# Patient Record
Sex: Female | Born: 1970
Health system: Southern US, Community
[De-identification: ages and names within clinical notes are randomized; demographics above are authoritative.]

## PROBLEM LIST (undated history)

## (undated) DIAGNOSIS — I10 Essential (primary) hypertension: Secondary | ICD-10-CM

## (undated) DIAGNOSIS — F419 Anxiety disorder, unspecified: Secondary | ICD-10-CM

## (undated) HISTORY — PX: WISDOM TOOTH EXTRACTION: SHX21

## (undated) HISTORY — DX: Essential (primary) hypertension: I10

## (undated) HISTORY — DX: Anxiety disorder, unspecified: F41.9

---

## 1998-09-12 ENCOUNTER — Other Ambulatory Visit: Admission: RE | Admit: 1998-09-12 | Discharge: 1998-09-12 | Payer: Self-pay | Admitting: Obstetrics and Gynecology

## 1999-04-30 ENCOUNTER — Other Ambulatory Visit: Admission: RE | Admit: 1999-04-30 | Discharge: 1999-04-30 | Payer: Self-pay | Admitting: Obstetrics and Gynecology

## 1999-10-28 ENCOUNTER — Other Ambulatory Visit: Admission: RE | Admit: 1999-10-28 | Discharge: 1999-10-28 | Payer: Self-pay | Admitting: Obstetrics and Gynecology

## 2000-11-23 ENCOUNTER — Other Ambulatory Visit: Admission: RE | Admit: 2000-11-23 | Discharge: 2000-11-23 | Payer: Self-pay | Admitting: Obstetrics and Gynecology

## 2002-01-09 ENCOUNTER — Other Ambulatory Visit: Admission: RE | Admit: 2002-01-09 | Discharge: 2002-01-09 | Payer: Self-pay | Admitting: Obstetrics and Gynecology

## 2003-01-11 ENCOUNTER — Other Ambulatory Visit: Admission: RE | Admit: 2003-01-11 | Discharge: 2003-01-11 | Payer: Self-pay | Admitting: Obstetrics and Gynecology

## 2004-01-30 ENCOUNTER — Other Ambulatory Visit: Admission: RE | Admit: 2004-01-30 | Discharge: 2004-01-30 | Payer: Self-pay | Admitting: Obstetrics and Gynecology

## 2004-08-15 ENCOUNTER — Inpatient Hospital Stay (HOSPITAL_COMMUNITY): Admission: AD | Admit: 2004-08-15 | Discharge: 2004-08-17 | Payer: Self-pay | Admitting: Obstetrics and Gynecology

## 2004-09-11 ENCOUNTER — Other Ambulatory Visit: Admission: RE | Admit: 2004-09-11 | Discharge: 2004-09-11 | Payer: Self-pay | Admitting: Obstetrics and Gynecology

## 2005-10-15 ENCOUNTER — Other Ambulatory Visit: Admission: RE | Admit: 2005-10-15 | Discharge: 2005-10-15 | Payer: Self-pay | Admitting: Obstetrics and Gynecology

## 2011-03-19 ENCOUNTER — Other Ambulatory Visit: Payer: Self-pay | Admitting: Obstetrics and Gynecology

## 2011-03-19 DIAGNOSIS — R928 Other abnormal and inconclusive findings on diagnostic imaging of breast: Secondary | ICD-10-CM

## 2011-03-31 ENCOUNTER — Ambulatory Visit
Admission: RE | Admit: 2011-03-31 | Discharge: 2011-03-31 | Disposition: A | Discharge status: Home / Self Care | Payer: Self-pay | Source: Ambulatory Visit | Attending: Obstetrics and Gynecology | Admitting: Obstetrics and Gynecology

## 2011-03-31 DIAGNOSIS — R928 Other abnormal and inconclusive findings on diagnostic imaging of breast: Secondary | ICD-10-CM

## 2014-10-10 ENCOUNTER — Other Ambulatory Visit: Payer: Self-pay | Admitting: Obstetrics and Gynecology

## 2014-10-10 DIAGNOSIS — Z803 Family history of malignant neoplasm of breast: Secondary | ICD-10-CM

## 2016-11-11 DIAGNOSIS — Z6820 Body mass index (BMI) 20.0-20.9, adult: Secondary | ICD-10-CM | POA: Diagnosis not present

## 2016-11-11 DIAGNOSIS — Z1231 Encounter for screening mammogram for malignant neoplasm of breast: Secondary | ICD-10-CM | POA: Diagnosis not present

## 2016-11-11 DIAGNOSIS — Z01419 Encounter for gynecological examination (general) (routine) without abnormal findings: Secondary | ICD-10-CM | POA: Diagnosis not present

## 2017-11-16 DIAGNOSIS — Z01419 Encounter for gynecological examination (general) (routine) without abnormal findings: Secondary | ICD-10-CM | POA: Diagnosis not present

## 2017-11-16 DIAGNOSIS — Z1231 Encounter for screening mammogram for malignant neoplasm of breast: Secondary | ICD-10-CM | POA: Diagnosis not present

## 2017-11-16 DIAGNOSIS — Z803 Family history of malignant neoplasm of breast: Secondary | ICD-10-CM | POA: Diagnosis not present

## 2017-11-16 DIAGNOSIS — Z6820 Body mass index (BMI) 20.0-20.9, adult: Secondary | ICD-10-CM | POA: Diagnosis not present

## 2017-11-16 DIAGNOSIS — Z808 Family history of malignant neoplasm of other organs or systems: Secondary | ICD-10-CM | POA: Diagnosis not present

## 2018-01-12 DIAGNOSIS — E785 Hyperlipidemia, unspecified: Secondary | ICD-10-CM | POA: Diagnosis not present

## 2018-01-12 DIAGNOSIS — Z809 Family history of malignant neoplasm, unspecified: Secondary | ICD-10-CM | POA: Diagnosis not present

## 2018-01-12 DIAGNOSIS — Z13220 Encounter for screening for lipoid disorders: Secondary | ICD-10-CM | POA: Diagnosis not present

## 2018-01-12 DIAGNOSIS — Z13228 Encounter for screening for other metabolic disorders: Secondary | ICD-10-CM | POA: Diagnosis not present

## 2018-01-12 DIAGNOSIS — Z1321 Encounter for screening for nutritional disorder: Secondary | ICD-10-CM | POA: Diagnosis not present

## 2018-01-12 DIAGNOSIS — I272 Pulmonary hypertension, unspecified: Secondary | ICD-10-CM | POA: Diagnosis not present

## 2018-01-12 DIAGNOSIS — R5383 Other fatigue: Secondary | ICD-10-CM | POA: Diagnosis not present

## 2018-01-12 DIAGNOSIS — Z1329 Encounter for screening for other suspected endocrine disorder: Secondary | ICD-10-CM | POA: Diagnosis not present

## 2018-01-12 DIAGNOSIS — E559 Vitamin D deficiency, unspecified: Secondary | ICD-10-CM | POA: Diagnosis not present

## 2018-01-20 ENCOUNTER — Other Ambulatory Visit: Payer: Self-pay | Admitting: Obstetrics and Gynecology

## 2018-01-20 DIAGNOSIS — Z803 Family history of malignant neoplasm of breast: Secondary | ICD-10-CM

## 2018-03-14 DIAGNOSIS — R030 Elevated blood-pressure reading, without diagnosis of hypertension: Secondary | ICD-10-CM | POA: Diagnosis not present

## 2018-04-19 DIAGNOSIS — R030 Elevated blood-pressure reading, without diagnosis of hypertension: Secondary | ICD-10-CM | POA: Diagnosis not present

## 2018-04-19 DIAGNOSIS — H524 Presbyopia: Secondary | ICD-10-CM | POA: Diagnosis not present

## 2018-04-19 DIAGNOSIS — Z0131 Encounter for examination of blood pressure with abnormal findings: Secondary | ICD-10-CM | POA: Diagnosis not present

## 2018-04-22 DIAGNOSIS — F419 Anxiety disorder, unspecified: Secondary | ICD-10-CM | POA: Diagnosis not present

## 2018-04-22 DIAGNOSIS — R030 Elevated blood-pressure reading, without diagnosis of hypertension: Secondary | ICD-10-CM | POA: Diagnosis not present

## 2019-01-04 DIAGNOSIS — F419 Anxiety disorder, unspecified: Secondary | ICD-10-CM | POA: Diagnosis not present

## 2019-01-09 DIAGNOSIS — Z6821 Body mass index (BMI) 21.0-21.9, adult: Secondary | ICD-10-CM | POA: Diagnosis not present

## 2019-01-09 DIAGNOSIS — Z1231 Encounter for screening mammogram for malignant neoplasm of breast: Secondary | ICD-10-CM | POA: Diagnosis not present

## 2019-01-09 DIAGNOSIS — Z01419 Encounter for gynecological examination (general) (routine) without abnormal findings: Secondary | ICD-10-CM | POA: Diagnosis not present

## 2019-01-11 ENCOUNTER — Other Ambulatory Visit: Payer: Self-pay | Admitting: Obstetrics and Gynecology

## 2019-01-11 DIAGNOSIS — R928 Other abnormal and inconclusive findings on diagnostic imaging of breast: Secondary | ICD-10-CM

## 2019-01-12 ENCOUNTER — Ambulatory Visit
Admission: RE | Admit: 2019-01-12 | Discharge: 2019-01-12 | Disposition: A | Discharge status: Home / Self Care | Payer: BC Managed Care – PPO | Source: Ambulatory Visit | Attending: Obstetrics and Gynecology | Admitting: Obstetrics and Gynecology

## 2019-01-12 ENCOUNTER — Ambulatory Visit: Payer: Self-pay

## 2019-01-12 ENCOUNTER — Other Ambulatory Visit: Payer: Self-pay

## 2019-01-12 DIAGNOSIS — R928 Other abnormal and inconclusive findings on diagnostic imaging of breast: Secondary | ICD-10-CM

## 2019-01-12 DIAGNOSIS — R922 Inconclusive mammogram: Secondary | ICD-10-CM | POA: Diagnosis not present

## 2019-01-12 DIAGNOSIS — N6002 Solitary cyst of left breast: Secondary | ICD-10-CM | POA: Diagnosis not present

## 2019-01-18 ENCOUNTER — Other Ambulatory Visit: Payer: Self-pay | Admitting: Obstetrics and Gynecology

## 2019-01-18 DIAGNOSIS — Z9189 Other specified personal risk factors, not elsewhere classified: Secondary | ICD-10-CM

## 2019-03-09 DIAGNOSIS — R0789 Other chest pain: Secondary | ICD-10-CM | POA: Diagnosis not present

## 2019-03-20 NOTE — Progress Notes (Signed)
Cardiology Office Note   Date:  03/22/2019   ID:  Emily Valdez, DOB Jan 26, 1971, MRN [id number]  PCP:  Joycelyn Rua, MD  Cardiologist:   No primary care provider on file. Referring:  Joycelyn Rua, MD  Chief Complaint  Patient presents with  . Abnormal ECG      History of Present Illness: Emily Valdez is a 48 y.o. female who is referred by Joycelyn Rua, MD for evaluation of palpitations and an abnormal EK.  The patient has no past cardiac history.  Recently she has had hypertension.  She has been started on Norvasc.  She has had some increased stress at home and perhaps some anxiety that is now being treated.  She reports strong heart beats.  She is not describing irregular beats and has had no syncope or presycnope.  The patient denies any new symptoms such as chest discomfort, neck or arm discomfort. There has been no new shortness of breath, PND or orthopnea. There have been no reported palpitations, presyncope or syncope.   She feels like the heart is beating hard .  This happens during the day at rest.  However, she is able to exercise vigorously without symptoms.  She does report that she has some chest discomfort that seems to be positional.  She might have this lying on her right or left side.  This seems to be in the mid portion of the chest.  She had an EKG at her primary care office with questionable old anterior MI.  There were no past EKGs for comparison.      Past Medical History:  Diagnosis Date  . Hypertension     History reviewed. No pertinent surgical history.   Current Outpatient Medications  Medication Sig Dispense Refill  . amLODipine (NORVASC) 2.5 MG tablet Take 2.5 mg by mouth daily.    Marland Kitchen escitalopram (LEXAPRO) 5 MG tablet Take 5 mg by mouth daily.    . norgestrel-ethinyl estradiol (CRYSELLE-28) 0.3-30 MG-MCG tablet Take 1 tablet by mouth daily.     No current facility-administered medications for this visit.     Allergies:   Patient has no  known allergies.    Social History:  The patient  reports that she has quit smoking. Her smoking use included cigarettes. She has never used smokeless tobacco.   Family History:  The patient's family history is not contributory for early coronary artery disease in a first-degree relative or sudden death and young ages.  There is no history of syncope or heart failure. family history is not on file.    ROS:  Please see the history of present illness.   Otherwise, review of systems are positive for none.   All other systems are reviewed and negative.    PHYSICAL EXAM: VS:  BP 132/88   Pulse 71   Ht 5\' 2"  (1.575 m)   Wt 121 lb 3.2 oz (55 kg)   BMI 22.17 kg/m  , BMI Body mass index is 22.17 kg/m. GENERAL:  Well appearing HEENT:  Pupils equal round and reactive, fundi not visualized, oral mucosa unremarkable NECK:  No jugular venous distention, waveform within normal limits, carotid upstroke brisk and symmetric, no bruits, no thyromegaly LYMPHATICS:  No cervical, inguinal adenopathy LUNGS:  Clear to auscultation bilaterally BACK:  No CVA tenderness CHEST:  Unremarkable HEART:  PMI not displaced or sustained,S1 and S2 within normal limits, no S3, no S4, no clicks, no rubs, no murmurs ABD:  Flat, positive bowel sounds normal  in frequency in pitch, no bruits, no rebound, no guarding, no midline pulsatile mass, no hepatomegaly, no splenomegaly EXT:  2 plus pulses throughout, no edema, no cyanosis no clubbing SKIN:  No rashes no nodules NEURO:  Cranial nerves II through XII grossly intact, motor grossly intact throughout PSYCH:  Cognitively intact, oriented to person place and time    EKG:  EKG is ordered today. The ekg ordered today demonstrates sinus rhythm, rate 71, axis within normal limits, intervals within normal limits, no acute ST-T wave changes.   Recent Labs: No results found for requested labs within last 8760 hours.    Lipid Panel No results found for: CHOL, TRIG, HDL,  CHOLHDL, VLDL, LDLCALC, LDLDIRECT    Wt Readings from Last 3 Encounters:  03/21/19 121 lb 3.2 oz (55 kg)      Other studies Reviewed: Additional studies/ records that were reviewed today include: EKG from primary . Review of the above records demonstrates:  Please see elsewhere in the note.     ASSESSMENT AND PLAN:  PALPITATIONS:    The patient describes some strong heartbeats.  Of asked her to get an Alive Cor device and possibly a blood pressure cuff.  This will help [place] when she is having those episodes.  I suspect that she is having sinus rhythm or potentially some ectopy but further management will be based on these results.  I will not empirically treat at this point.  Await for more data points.  HTN: Blood pressures controlled.  If however she needs future med titration and has any further strong heartbeats perhaps she would be better with Cardizem or even a beta-blocker.  ABNORMAL EKG: The the patient's poor anterior R wave progression with lead placement.  I do not suspect a structurally abnormal heart or other issues given the normal appearance of the EKG today.  Current medicines are reviewed at length with the patient today.  The patient does not have concerns regarding medicines.  The following changes have been made:  no change  Labs/ tests ordered today include:   Orders Placed This Encounter  Procedures  . EKG 12-Lead     Disposition:   FU with me as needed based on there results of the above    Signed, Minus Breeding, MD  03/22/2019 4:43 PM    [facility]

## 2019-03-21 ENCOUNTER — Ambulatory Visit: Payer: BC Managed Care – PPO | Admitting: Cardiology

## 2019-03-21 ENCOUNTER — Encounter: Payer: Self-pay | Admitting: Cardiology

## 2019-03-21 ENCOUNTER — Other Ambulatory Visit: Payer: Self-pay

## 2019-03-21 VITALS — BP 132/88 | HR 71 | Ht 62.0 in | Wt 121.2 lb

## 2019-03-21 DIAGNOSIS — R0789 Other chest pain: Secondary | ICD-10-CM | POA: Diagnosis not present

## 2019-03-21 DIAGNOSIS — R079 Chest pain, unspecified: Secondary | ICD-10-CM

## 2019-03-21 NOTE — Patient Instructions (Signed)
Medication Instructions:  Your physician recommends that you continue on your current medications as directed. Please refer to the Current Medication list given to you today.  If you need a refill on your cardiac medications before your next appointment, please call your pharmacy.   Lab work: NONE  Testing/Procedures: NONE  Follow-Up: As needed   Any Other Special Instructions Will Be Listed Below (If Applicable). Invest in an [employer] Blood Pressure Machine.  Use the app Alivecore.

## 2019-03-22 ENCOUNTER — Encounter: Payer: Self-pay | Admitting: Cardiology

## 2019-07-07 DIAGNOSIS — I10 Essential (primary) hypertension: Secondary | ICD-10-CM | POA: Diagnosis not present

## 2019-07-07 DIAGNOSIS — F419 Anxiety disorder, unspecified: Secondary | ICD-10-CM | POA: Diagnosis not present

## 2019-12-01 DIAGNOSIS — M25571 Pain in right ankle and joints of right foot: Secondary | ICD-10-CM | POA: Diagnosis not present

## 2020-01-15 DIAGNOSIS — Z1231 Encounter for screening mammogram for malignant neoplasm of breast: Secondary | ICD-10-CM | POA: Diagnosis not present

## 2020-06-03 DIAGNOSIS — Z6823 Body mass index (BMI) 23.0-23.9, adult: Secondary | ICD-10-CM | POA: Diagnosis not present

## 2020-06-03 DIAGNOSIS — Z01419 Encounter for gynecological examination (general) (routine) without abnormal findings: Secondary | ICD-10-CM | POA: Diagnosis not present

## 2020-07-09 DIAGNOSIS — F419 Anxiety disorder, unspecified: Secondary | ICD-10-CM | POA: Diagnosis not present

## 2020-07-09 DIAGNOSIS — I10 Essential (primary) hypertension: Secondary | ICD-10-CM | POA: Diagnosis not present

## 2020-10-23 ENCOUNTER — Telehealth: Payer: Self-pay | Admitting: *Deleted

## 2020-10-23 NOTE — Telephone Encounter (Signed)
Pt came in for PV and RS to July 19- this is more than 60 days from today so PV Rs as well- I did give her Miralax danis prep instructions today- we went over these instructions- Pv Rs to 7-5 at 1 pm- will be a virtual PV

## 2020-11-08 ENCOUNTER — Encounter: Payer: BC Managed Care – PPO | Admitting: Gastroenterology

## 2020-12-09 ENCOUNTER — Other Ambulatory Visit: Payer: Self-pay

## 2020-12-24 ENCOUNTER — Ambulatory Visit (AMBULATORY_SURGERY_CENTER): Payer: BC Managed Care – PPO

## 2020-12-24 ENCOUNTER — Other Ambulatory Visit: Payer: Self-pay

## 2020-12-24 VITALS — Ht 62.0 in | Wt 126.0 lb

## 2020-12-24 DIAGNOSIS — Z1211 Encounter for screening for malignant neoplasm of colon: Secondary | ICD-10-CM

## 2020-12-24 DIAGNOSIS — Z800 Family history of malignant neoplasm of digestive organs: Secondary | ICD-10-CM

## 2020-12-24 NOTE — Progress Notes (Signed)
Patient's pre-visit was done today over the phone with the patient due to COVID-19 pandemic. Name,DOB and address verified. Insurance verified. Patient denies any allergies to Eggs and Soy. Patient denies any problems with anesthesia/sedation. Patient denies taking diet pills or blood thinners.  Patient understands to call us back with any questions or concerns. Patient is aware of our care-partner policy and Covid-19 safety protocol.   The patient is COVID-19 vaccinated, per patient.    Pt said she had Split dose Miralax prep instructions at home.   Pt had no questions.

## 2021-01-03 ENCOUNTER — Encounter: Payer: Self-pay | Admitting: Gastroenterology

## 2021-01-07 ENCOUNTER — Encounter: Payer: Self-pay | Admitting: Gastroenterology

## 2021-01-07 ENCOUNTER — Other Ambulatory Visit: Payer: Self-pay

## 2021-01-07 ENCOUNTER — Ambulatory Visit (AMBULATORY_SURGERY_CENTER): Payer: BC Managed Care – PPO | Admitting: Gastroenterology

## 2021-01-07 VITALS — BP 127/86 | HR 66 | Temp 98.7°F | Resp 12 | Ht 62.0 in | Wt 126.0 lb

## 2021-01-07 DIAGNOSIS — Z800 Family history of malignant neoplasm of digestive organs: Secondary | ICD-10-CM | POA: Diagnosis not present

## 2021-01-07 DIAGNOSIS — Z1211 Encounter for screening for malignant neoplasm of colon: Secondary | ICD-10-CM

## 2021-01-07 MED ORDER — SODIUM CHLORIDE 0.9 % IV SOLN: 500.0000 mL | Freq: Once | INTRAVENOUS | Status: DC

## 2021-01-07 NOTE — Progress Notes (Signed)
To PACU, VSS. Report to [email or web address].tb 

## 2021-01-07 NOTE — Progress Notes (Signed)
CW - VS   

## 2021-01-07 NOTE — Patient Instructions (Signed)
Repeat colonoscopy in 5 years for screening purposes. ° °YOU HAD AN ENDOSCOPIC PROCEDURE TODAY AT THE [facility]:   Refer to the procedure report that was given to you for any specific questions about what was found during the examination.  If the procedure report does not answer your questions, please call your gastroenterologist to clarify.  If you requested that your care partner not be given the details of your procedure findings, then the procedure report has been included in a sealed envelope for you to review at your convenience later. ° °YOU SHOULD EXPECT: Some feelings of bloating in the abdomen. Passage of more gas than usual.  Walking can help get rid of the air that was put into your GI tract during the procedure and reduce the bloating. If you had a lower endoscopy (such as a colonoscopy or flexible sigmoidoscopy) you may notice spotting of blood in your stool or on the toilet paper. If you underwent a bowel prep for your procedure, you may not have a normal bowel movement for a few days. ° °Please Note:  You might notice some irritation and congestion in your nose or some drainage.  This is from the oxygen used during your procedure.  There is no need for concern and it should clear up in a day or so. ° °SYMPTOMS TO REPORT IMMEDIATELY: ° °Following lower endoscopy (colonoscopy or flexible sigmoidoscopy): ° Excessive amounts of blood in the stool ° Significant tenderness or worsening of abdominal pains ° Swelling of the abdomen that is new, acute ° Fever of 100°F or higher ° °For urgent or emergent issues, a gastroenterologist can be reached at any hour by calling [phone]. °Do not use MyChart messaging for urgent concerns.  ° ° °DIET:  We do recommend a small meal at first, but then you may proceed to your regular diet.  Drink plenty of fluids but you should avoid alcoholic beverages for 24 hours. ° °ACTIVITY:  You should plan to take it easy for the rest of today and you should NOT  DRIVE or use heavy machinery until tomorrow (because of the sedation medicines used during the test).   ° °FOLLOW UP: °Our staff will call the number listed on your records 48-72 hours following your procedure to check on you and address any questions or concerns that you may have regarding the information given to you following your procedure. If we do not reach you, we will leave a message.  We will attempt to reach you two times.  During this call, we will ask if you have developed any symptoms of COVID 19. If you develop any symptoms (ie: fever, flu-like symptoms, shortness of breath, cough etc.) before then, please call [phone].  If you test positive for Covid 19 in the 2 weeks post procedure, please call and report this information to us.   ° °If any biopsies were taken you will be contacted by phone or by letter within the next 1-3 weeks.  Please call us at [phone] if you have not heard about the biopsies in 3 weeks.  ° ° °SIGNATURES/CONFIDENTIALITY: °You and/or your care partner have signed paperwork which will be entered into your electronic medical record.  These signatures attest to the fact that that the information above on your After Visit Summary has been reviewed and is understood.  Full responsibility of the confidentiality of this discharge information lies with you and/or your care-partner. °  °

## 2021-01-07 NOTE — Op Note (Signed)
[facility] Patient Name: Emily Valdez Procedure Date: 01/07/2021 8:38 AM MRN: [id number] Endoscopist: Sherilyn Cooter L. Myrtie Neither , MD Age: 50 Referring MD:  Date of Birth: Jun 22, 1971 Gender: Female Account #: [id number] Procedure:                Colonoscopy Indications:              Screening in patient at increased risk: Colorectal                            cancer in father 38 or older, This is the patient's                            first colonoscopy Medicines:                Monitored Anesthesia Care Procedure:                Pre-Anesthesia Assessment:                           - Prior to the procedure, a History and Physical                            was performed, and patient medications and                            allergies were reviewed. The patient's tolerance of                            previous anesthesia was also reviewed. The risks                            and benefits of the procedure and the sedation                            options and risks were discussed with the patient.                            All questions were answered, and informed consent                            was obtained. Prior Anticoagulants: The patient has                            taken no previous anticoagulant or antiplatelet                            agents. ASA Grade Assessment: II - A patient with                            mild systemic disease. After reviewing the risks                            and benefits, the patient was deemed in  satisfactory condition to undergo the procedure.                           After obtaining informed consent, the colonoscope                            was passed under direct vision. Throughout the                            procedure, the patient's blood pressure, pulse, and                            oxygen saturations were monitored continuously. The                            Olympus PCF-H190DL (#[id number])  Colonoscope was                            introduced through the anus and advanced to the the                            cecum, identified by appendiceal orifice and                            ileocecal valve. The colonoscopy was somewhat                            difficult due to a redundant colon. Successful                            completion of the procedure was aided by changing                            the patient to a semi-supine position and using                            manual pressure. The patient tolerated the                            procedure well. The quality of the bowel                            preparation was excellent after lavage. The bowel                            preparation used was Miralax. Scope In: 8:42:55 AM Scope Out: 9:01:41 AM Scope Withdrawal Time: 0 hours 10 minutes 55 seconds  Total Procedure Duration: 0 hours 18 minutes 46 seconds  Findings:                 The perianal and digital rectal examinations were                            normal.  The entire examined colon appeared normal on direct                            and retroflexion views. Complications:            No immediate complications. Estimated Blood Loss:     Estimated blood loss: none. Impression:               - The entire examined colon is normal on direct and                            retroflexion views.                           - No specimens collected. Recommendation:           - Patient has a contact number available for                            emergencies. The signs and symptoms of potential                            delayed complications were discussed with the                            patient. Return to normal activities tomorrow.                            Written discharge instructions were provided to the                            patient.                           - Resume previous diet.                           - Continue  present medications.                           - Repeat colonoscopy in 5 years for screening                            purposes. Pamalee Marcoe L. Myrtie Neither, MD 01/07/2021 9:05:50 AM This report has been signed electronically.

## 2021-01-09 ENCOUNTER — Telehealth: Payer: Self-pay

## 2021-01-09 NOTE — Telephone Encounter (Signed)
  Follow up Call-  Call back number 01/07/2021  Post procedure Call Back phone  # [phone]  Permission to leave phone message Yes  Some recent data might be hidden     Patient questions:  Do you have a fever, pain , or abdominal swelling? No. Pain Score  0 *  Have you tolerated food without any problems? Yes.    Have you been able to return to your normal activities? Yes.    Do you have any questions about your discharge instructions: Diet   No. Medications  No. Follow up visit  No.  Do you have questions or concerns about your Care? No.  Actions: * If pain score is 4 or above: No action needed, pain <4.  Have you developed a fever since your procedure? no  2.   Have you had an respiratory symptoms (SOB or cough) since your procedure? no  3.   Have you tested positive for COVID 19 since your procedure no  4.   Have you had any family members/close contacts diagnosed with the COVID 19 since your procedure?  no   If yes to any of these questions please route to Laverna Peace, RN and Karlton Lemon, RN

## 2021-01-09 NOTE — Telephone Encounter (Signed)
First attempt follow up call to pt, No answer. 

## 2021-03-10 DIAGNOSIS — F419 Anxiety disorder, unspecified: Secondary | ICD-10-CM | POA: Diagnosis not present

## 2021-03-10 DIAGNOSIS — I10 Essential (primary) hypertension: Secondary | ICD-10-CM | POA: Diagnosis not present

## 2021-06-18 DIAGNOSIS — Z01419 Encounter for gynecological examination (general) (routine) without abnormal findings: Secondary | ICD-10-CM | POA: Diagnosis not present

## 2021-06-18 DIAGNOSIS — Z6822 Body mass index (BMI) 22.0-22.9, adult: Secondary | ICD-10-CM | POA: Diagnosis not present

## 2021-06-18 DIAGNOSIS — Z1231 Encounter for screening mammogram for malignant neoplasm of breast: Secondary | ICD-10-CM | POA: Diagnosis not present

## 2021-06-24 ENCOUNTER — Other Ambulatory Visit: Payer: Self-pay | Admitting: Obstetrics and Gynecology

## 2021-06-24 DIAGNOSIS — R928 Other abnormal and inconclusive findings on diagnostic imaging of breast: Secondary | ICD-10-CM

## 2021-06-26 ENCOUNTER — Ambulatory Visit
Admission: RE | Admit: 2021-06-26 | Discharge: 2021-06-26 | Disposition: A | Discharge status: Home / Self Care | Payer: BC Managed Care – PPO | Source: Ambulatory Visit | Attending: Obstetrics and Gynecology | Admitting: Obstetrics and Gynecology

## 2021-06-26 DIAGNOSIS — R928 Other abnormal and inconclusive findings on diagnostic imaging of breast: Secondary | ICD-10-CM

## 2021-06-26 DIAGNOSIS — R922 Inconclusive mammogram: Secondary | ICD-10-CM | POA: Diagnosis not present

## 2021-08-01 DIAGNOSIS — L918 Other hypertrophic disorders of the skin: Secondary | ICD-10-CM | POA: Diagnosis not present

## 2021-08-01 DIAGNOSIS — L304 Erythema intertrigo: Secondary | ICD-10-CM | POA: Diagnosis not present

## 2021-08-01 DIAGNOSIS — D225 Melanocytic nevi of trunk: Secondary | ICD-10-CM | POA: Diagnosis not present

## 2021-08-01 DIAGNOSIS — L710 Perioral dermatitis: Secondary | ICD-10-CM | POA: Diagnosis not present

## 2021-08-01 DIAGNOSIS — L578 Other skin changes due to chronic exposure to nonionizing radiation: Secondary | ICD-10-CM | POA: Diagnosis not present

## 2021-08-01 DIAGNOSIS — D485 Neoplasm of uncertain behavior of skin: Secondary | ICD-10-CM | POA: Diagnosis not present

## 2021-09-04 DIAGNOSIS — Z13820 Encounter for screening for osteoporosis: Secondary | ICD-10-CM | POA: Diagnosis not present

## 2022-03-04 DIAGNOSIS — F419 Anxiety disorder, unspecified: Secondary | ICD-10-CM | POA: Diagnosis not present

## 2022-03-04 DIAGNOSIS — I10 Essential (primary) hypertension: Secondary | ICD-10-CM | POA: Diagnosis not present

## 2022-03-04 DIAGNOSIS — R1013 Epigastric pain: Secondary | ICD-10-CM | POA: Diagnosis not present

## 2022-08-13 DIAGNOSIS — Z1231 Encounter for screening mammogram for malignant neoplasm of breast: Secondary | ICD-10-CM | POA: Diagnosis not present

## 2022-08-13 DIAGNOSIS — Z01419 Encounter for gynecological examination (general) (routine) without abnormal findings: Secondary | ICD-10-CM | POA: Diagnosis not present

## 2022-08-17 ENCOUNTER — Other Ambulatory Visit: Payer: Self-pay | Admitting: Obstetrics and Gynecology

## 2022-08-17 DIAGNOSIS — Z803 Family history of malignant neoplasm of breast: Secondary | ICD-10-CM

## 2022-09-02 DIAGNOSIS — I10 Essential (primary) hypertension: Secondary | ICD-10-CM | POA: Diagnosis not present

## 2022-09-02 DIAGNOSIS — F419 Anxiety disorder, unspecified: Secondary | ICD-10-CM | POA: Diagnosis not present

## 2022-09-24 DIAGNOSIS — E8941 Symptomatic postprocedural ovarian failure: Secondary | ICD-10-CM | POA: Diagnosis not present

## 2022-10-11 ENCOUNTER — Emergency Department (HOSPITAL_BASED_OUTPATIENT_CLINIC_OR_DEPARTMENT_OTHER): Payer: BC Managed Care – PPO | Admitting: Radiology

## 2022-10-11 ENCOUNTER — Emergency Department (HOSPITAL_BASED_OUTPATIENT_CLINIC_OR_DEPARTMENT_OTHER)
Admission: EM | Admit: 2022-10-11 | Discharge: 2022-10-11 | Disposition: A | Discharge status: Home / Self Care | Payer: BC Managed Care – PPO | Attending: Emergency Medicine | Admitting: Emergency Medicine

## 2022-10-11 DIAGNOSIS — W274XXA Contact with kitchen utensil, initial encounter: Secondary | ICD-10-CM | POA: Insufficient documentation

## 2022-10-11 DIAGNOSIS — Z23 Encounter for immunization: Secondary | ICD-10-CM | POA: Insufficient documentation

## 2022-10-11 DIAGNOSIS — S61411A Laceration without foreign body of right hand, initial encounter: Secondary | ICD-10-CM | POA: Diagnosis not present

## 2022-10-11 DIAGNOSIS — S61216A Laceration without foreign body of right little finger without damage to nail, initial encounter: Secondary | ICD-10-CM | POA: Diagnosis not present

## 2022-10-11 MED ORDER — LIDOCAINE-EPINEPHRINE-TETRACAINE (LET) TOPICAL GEL
3.0000 mL | Freq: Once | TOPICAL | Status: AC
  Administered 2022-10-11: 3 mL via TOPICAL
  Filled 2022-10-11: qty 3

## 2022-10-11 MED ORDER — LIDOCAINE-EPINEPHRINE (PF) 2 %-1:200000 IJ SOLN: 20.0000 mL | Freq: Once | INTRAMUSCULAR | Status: DC

## 2022-10-11 MED ORDER — LIDOCAINE HCL 2 % IJ SOLN
10.0000 mL | Freq: Once | INTRAMUSCULAR | Status: DC
  Filled 2022-10-11: qty 20

## 2022-10-11 MED ORDER — TETANUS-DIPHTH-ACELL PERTUSSIS 5-2.5-18.5 LF-MCG/0.5 IM SUSY
0.5000 mL | PREFILLED_SYRINGE | Freq: Once | INTRAMUSCULAR | Status: AC
  Administered 2022-10-11: 0.5 mL via INTRAMUSCULAR
  Filled 2022-10-11: qty 0.5

## 2022-10-11 NOTE — Discharge Instructions (Signed)
You are seen in the emergency department for a laceration.  Thankfully there is no evidence of any fractures or dislocations in your finger for this laceration.  There is no nail involvement either.  A simple suture repair was performed on the pinky of the right hand with 5 sutures placed.  I would advise a follow-up with her primary provider as well as a hand surgeon to ensure that there is healing without any complications.  If you have any concerns for any signs of infection such as redness, purulent drainage, or inability to move the finger please return to the emergency department for evaluation.

## 2022-10-11 NOTE — ED Provider Notes (Signed)
[place] EMERGENCY DEPARTMENT AT [facility] Provider Note   CSN: [id number] Arrival date & time: 10/11/22  1719     History Chief Complaint  Patient presents with   Laceration    Emily Valdez is a 52 y.o. female.  Patient presents emergency department for a laceration to the right pinky.  She reports that she was using a mandolin and she sliced into her finger.  She reports the bleeding is mostly contained prior to arriving in the emergency department.  Unsure of last tetanus shot.  Denies any significant pain or loss of ability to function over the affected pinky.   Laceration      Home Medications Prior to Admission medications   Medication Sig Start Date End Date Taking? Authorizing Provider  amLODipine (NORVASC) 2.5 MG tablet Take 2.5 mg by mouth daily. 03/09/19   [provider]  escitalopram (LEXAPRO) 5 MG tablet Take 5 mg by mouth daily. 01/04/19   [provider]  norgestrel-ethinyl estradiol (CRYSELLE-28) 0.3-30 MG-MCG tablet Take 1 tablet by mouth daily.    [provider]  VITAMIN D PO Take by mouth.    [provider]      Allergies    Patient has no known allergies.    Review of Systems   Review of Systems  Skin:  Positive for wound.  All other systems reviewed and are negative.   Physical Exam Updated Vital Signs BP (!) 143/100   Pulse 65   Temp 98.3 F (36.8 C) (Oral)   Resp 20   SpO2 100%  Physical Exam Vitals and nursing note reviewed.  HENT:     Head: Normocephalic and atraumatic.  Eyes:     General: No scleral icterus.    Conjunctiva/sclera: Conjunctivae normal.  Musculoskeletal:     Comments: Range of motion testing in patient's right hand normal without any acute deficiencies noted.  No limitations due to pain.  Skin:    General: Skin is warm and dry.     Capillary Refill: Capillary refill takes less than 2 seconds.     Findings: Lesion present.     Comments: 4 cm laceration noted to the  right little finger.  There appears to be a skin flap present from the laceration sustained.  Minimal active bleeding at this time.     ED Results / Procedures / Treatments   Labs (all labs ordered are listed, but only abnormal results are displayed) Labs Reviewed - No data to display  EKG None  Radiology DG Hand Complete Right  Result Date: 10/11/2022 CLINICAL DATA:  Laceration to the fourth and fifth digits. EXAM: RIGHT HAND - COMPLETE 3+ VIEW COMPARISON:  None Available. FINDINGS: There is no evidence of fracture or dislocation. There is no evidence of arthropathy or other focal bone abnormality. No radiopaque foreign body is identified. IMPRESSION: No acute osseous injury or radiopaque foreign body. Electronically Signed   By: Romona Curls M.D.   On: 10/11/2022 18:32    Procedures .Marland KitchenLaceration Repair  Date/Time: 10/11/2022 9:39 PM  Performed by: Smitty Knudsen, PA-C Authorized by: Smitty Knudsen, PA-C   Consent:    Consent obtained:  Verbal   Consent given by:  Patient   Risks, benefits, and alternatives were discussed: yes     Risks discussed:  Infection and pain Universal protocol:    Patient identity confirmed:  Verbally with patient Anesthesia:    Anesthesia method:  Nerve block   Block needle gauge:  25 G  Block anesthetic:  Lidocaine 1% w/o epi   Block injection procedure:  Anatomic landmarks identified, introduced needle and negative aspiration for blood   Block outcome:  Anesthesia achieved Laceration details:    Location:  Finger   Finger location:  R small finger   Length (cm):  4   Depth (mm):  2 Pre-procedure details:    Preparation:  Patient was prepped and draped in usual sterile fashion Exploration:    Limited defect created (wound extended): yes     Hemostasis achieved with:  Direct pressure   Imaging obtained: x-ray     Imaging outcome: foreign body not noted     Contaminated: no   Treatment:    Area cleansed with:  Povidone-iodine and  saline   Amount of cleaning:  Standard   Irrigation solution:  Sterile saline   Irrigation method:  Syringe   Visualized foreign bodies/material removed: no   Skin repair:    Repair method:  Sutures   Suture size:  5-0   Suture material:  Nylon   Number of sutures:  5 Approximation:    Approximation:  Loose Repair type:    Repair type:  Simple Post-procedure details:    Dressing:  Non-adherent dressing    Medications Ordered in ED Medications  lidocaine (XYLOCAINE) 2 % (with pres) injection 200 mg (has no administration in time range)  Tdap (BOOSTRIX) injection 0.5 mL (0.5 mLs Intramuscular Given 10/11/22 1856)  lidocaine-EPINEPHrine-tetracaine (LET) topical gel (3 mLs Topical Given 10/11/22 1856)    ED Course/ Medical Decision Making/ A&P                           Medical Decision Making Amount and/or Complexity of Data Reviewed Radiology: ordered.  Risk Prescription drug management.   This patient presents to the ED for concern of laceration.  Differential diagnosis includes nail avulsion, skin laceration, puncture wound, dislocation or fracture of the phalange   Imaging Studies ordered:  I ordered imaging studies including x-ray of right hand I independently visualized and interpreted imaging which showed no evidence of any foreign bodies retained objects I agree with the radiologist interpretation   Medicines ordered and prescription drug management:  I ordered medication including Tdap, lidocaine for tetanus prophylaxis, local anesthetic to Reevaluation of the patient after these medicines showed that the patient improved I have reviewed the patients home medicines and have made adjustments as needed   Problem List / ED Course:  Patient presents emergency department complaints of a laceration to the right pinky.  She reports she was using a mandolin to cut her hand on hand.  Unsure if she is up-to-date on last tetanus.  Reports that she is baseline mobility  and function with this pinky.  Denies any significant derangements in function.  Laceration repair performed using 5-0 Vicryl sutures to reapproximate the area for improved healing.  Advised patient of proper wound care management for the repaired site.  Encourage patient to follow-up with primary care provider to ensure area is healing without any complications.  Also advised patient to follow-up with hand surgeon for further evaluation to ensure there are no acute abnormalities or concerns arise with healing.  Patient is agreeable to treatment plan verbalized understanding return precautions.  All questions answered prior to patient discharge.  Final Clinical Impression(s) / ED Diagnoses Final diagnoses:  Laceration of right little finger without foreign body without damage to nail, initial encounter    Rx / DC  Orders ED Discharge Orders     None         Salomon Mast 10/11/22 2140    Loetta Rough, MD 10/13/22 1426

## 2022-10-11 NOTE — ED Notes (Signed)
Provider at bedside for lac repair

## 2022-10-11 NOTE — ED Notes (Signed)
Pt verbalized understanding of d/c instructions, meds, and followup care. Denies questions. VSS, no distress noted. Steady gait to exit with all belongings.  ?

## 2022-10-11 NOTE — ED Triage Notes (Signed)
Cut right pinky finger with mandolin- 2cm lac- bleeding controlled with pressure. Small lac to ring finger as well. Unsure of tetanus vaccine.

## 2023-02-16 ENCOUNTER — Ambulatory Visit
Admission: RE | Admit: 2023-02-16 | Discharge: 2023-02-16 | Disposition: A | Discharge status: Home / Self Care | Payer: BC Managed Care – PPO | Source: Ambulatory Visit | Attending: Obstetrics and Gynecology | Admitting: Obstetrics and Gynecology

## 2023-02-16 DIAGNOSIS — Z803 Family history of malignant neoplasm of breast: Secondary | ICD-10-CM | POA: Diagnosis not present

## 2023-02-16 MED ORDER — GADOPICLENOL 0.5 MMOL/ML IV SOLN
6.0000 mL | Freq: Once | INTRAVENOUS | Status: AC | PRN
  Administered 2023-02-16: 6 mL via INTRAVENOUS

## 2023-07-23 DIAGNOSIS — M25871 Other specified joint disorders, right ankle and foot: Secondary | ICD-10-CM | POA: Diagnosis not present

## 2023-07-23 DIAGNOSIS — M25571 Pain in right ankle and joints of right foot: Secondary | ICD-10-CM | POA: Diagnosis not present

## 2023-07-23 DIAGNOSIS — S93492A Sprain of other ligament of left ankle, initial encounter: Secondary | ICD-10-CM | POA: Diagnosis not present

## 2023-07-23 DIAGNOSIS — M25572 Pain in left ankle and joints of left foot: Secondary | ICD-10-CM | POA: Diagnosis not present

## 2023-08-10 DIAGNOSIS — M25572 Pain in left ankle and joints of left foot: Secondary | ICD-10-CM | POA: Diagnosis not present

## 2023-08-10 DIAGNOSIS — M25672 Stiffness of left ankle, not elsewhere classified: Secondary | ICD-10-CM | POA: Diagnosis not present

## 2023-08-17 DIAGNOSIS — Z1151 Encounter for screening for human papillomavirus (HPV): Secondary | ICD-10-CM | POA: Diagnosis not present

## 2023-08-17 DIAGNOSIS — Z01419 Encounter for gynecological examination (general) (routine) without abnormal findings: Secondary | ICD-10-CM | POA: Diagnosis not present

## 2023-08-17 DIAGNOSIS — Z1231 Encounter for screening mammogram for malignant neoplasm of breast: Secondary | ICD-10-CM | POA: Diagnosis not present

## 2023-08-17 DIAGNOSIS — Z6822 Body mass index (BMI) 22.0-22.9, adult: Secondary | ICD-10-CM | POA: Diagnosis not present

## 2023-08-17 DIAGNOSIS — Z124 Encounter for screening for malignant neoplasm of cervix: Secondary | ICD-10-CM | POA: Diagnosis not present

## 2023-08-18 DIAGNOSIS — M25572 Pain in left ankle and joints of left foot: Secondary | ICD-10-CM | POA: Diagnosis not present

## 2023-08-18 DIAGNOSIS — M25672 Stiffness of left ankle, not elsewhere classified: Secondary | ICD-10-CM | POA: Diagnosis not present

## 2023-08-20 IMAGING — MG MM DIGITAL DIAGNOSTIC UNILAT*L* W/ TOMO W/ CAD
4 series · 4 of 12 positions shown · non-contrast
Comparison: Previous exams including recent screening mammogram
dated 06/18/2021 and LEFT breast ultrasound dated 01/12/2019.

CLINICAL DATA: Patient returns today to evaluate a possible LEFT
breast asymmetry questioned on recent screening mammogram. History
of LEFT breast cyst.

EXAM:
DIGITAL DIAGNOSTIC UNILATERAL LEFT MAMMOGRAM WITH TOMOSYNTHESIS AND
CAD; ULTRASOUND LEFT BREAST LIMITED
TECHNIQUE: Left digital diagnostic mammography and breast tomosynthesis was
performed. The images were evaluated with computer-aided detection.;
Targeted ultrasound examination of the left breast was performed.

[L ML synth-2D]
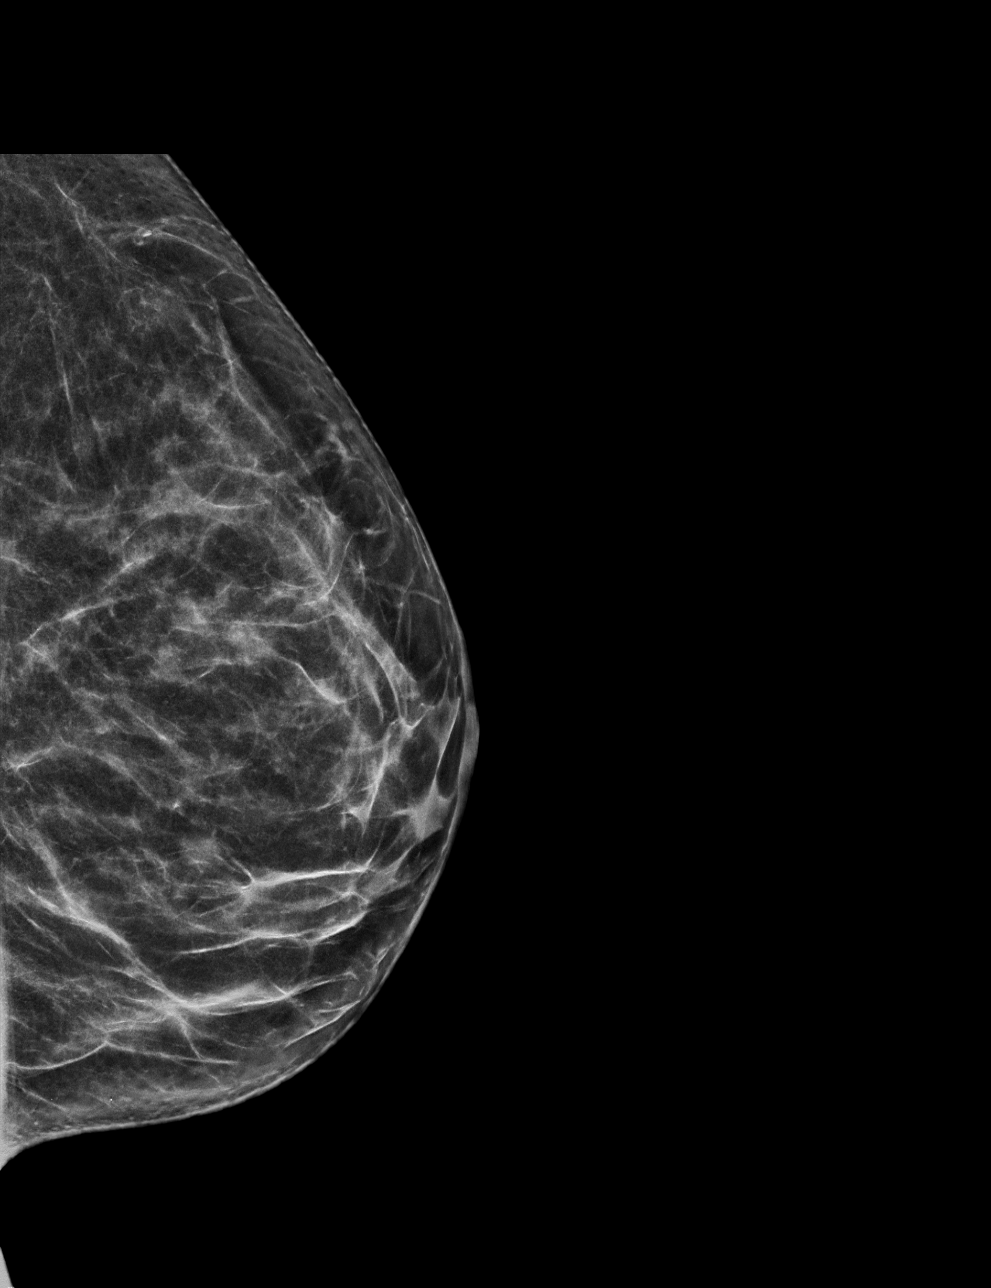

[L MLO synth-2D]
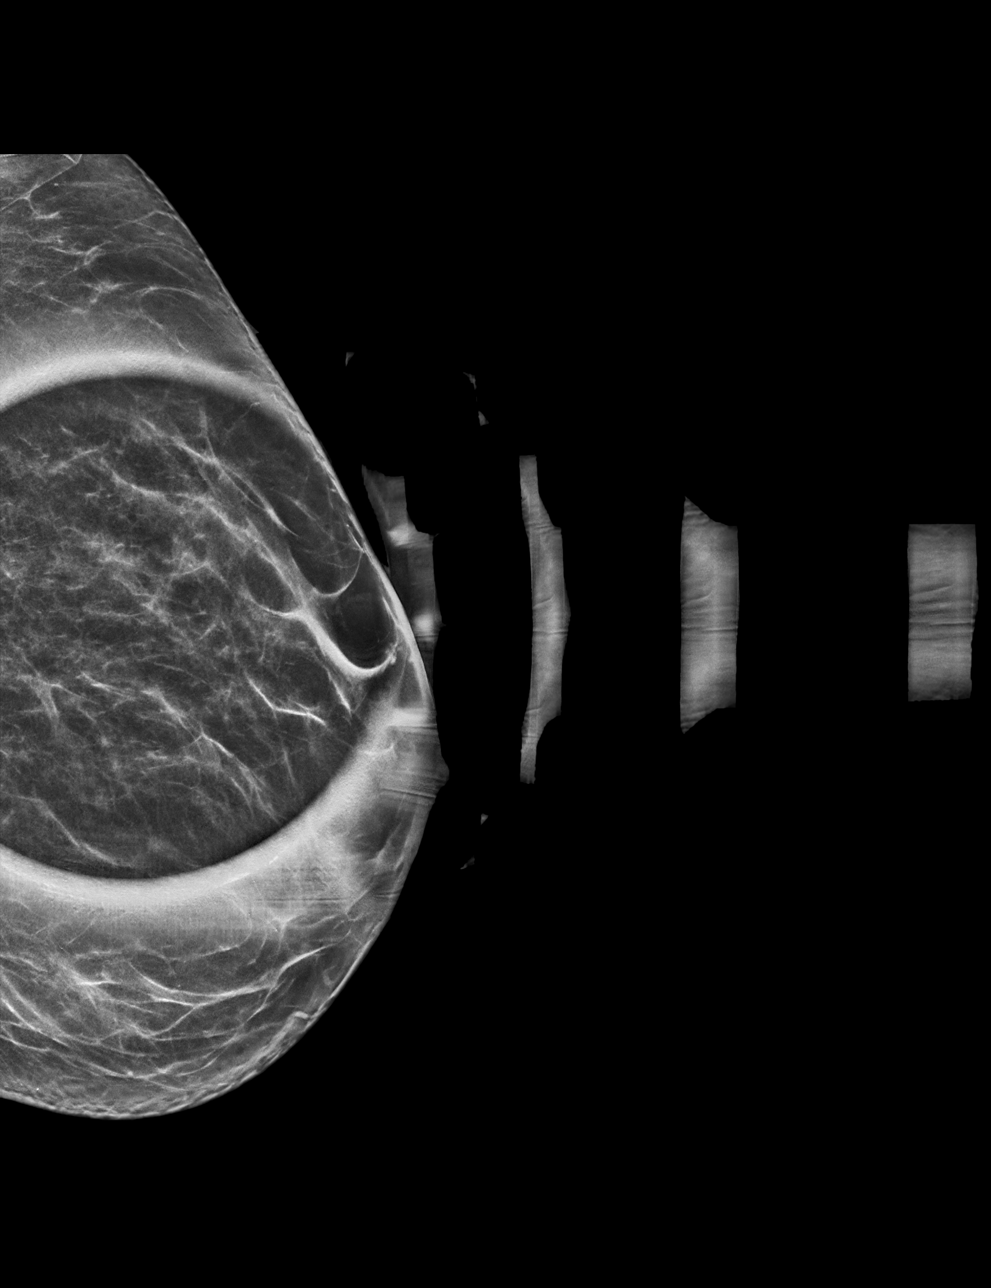

[L ML tomo · tomo slice 29/56.0]
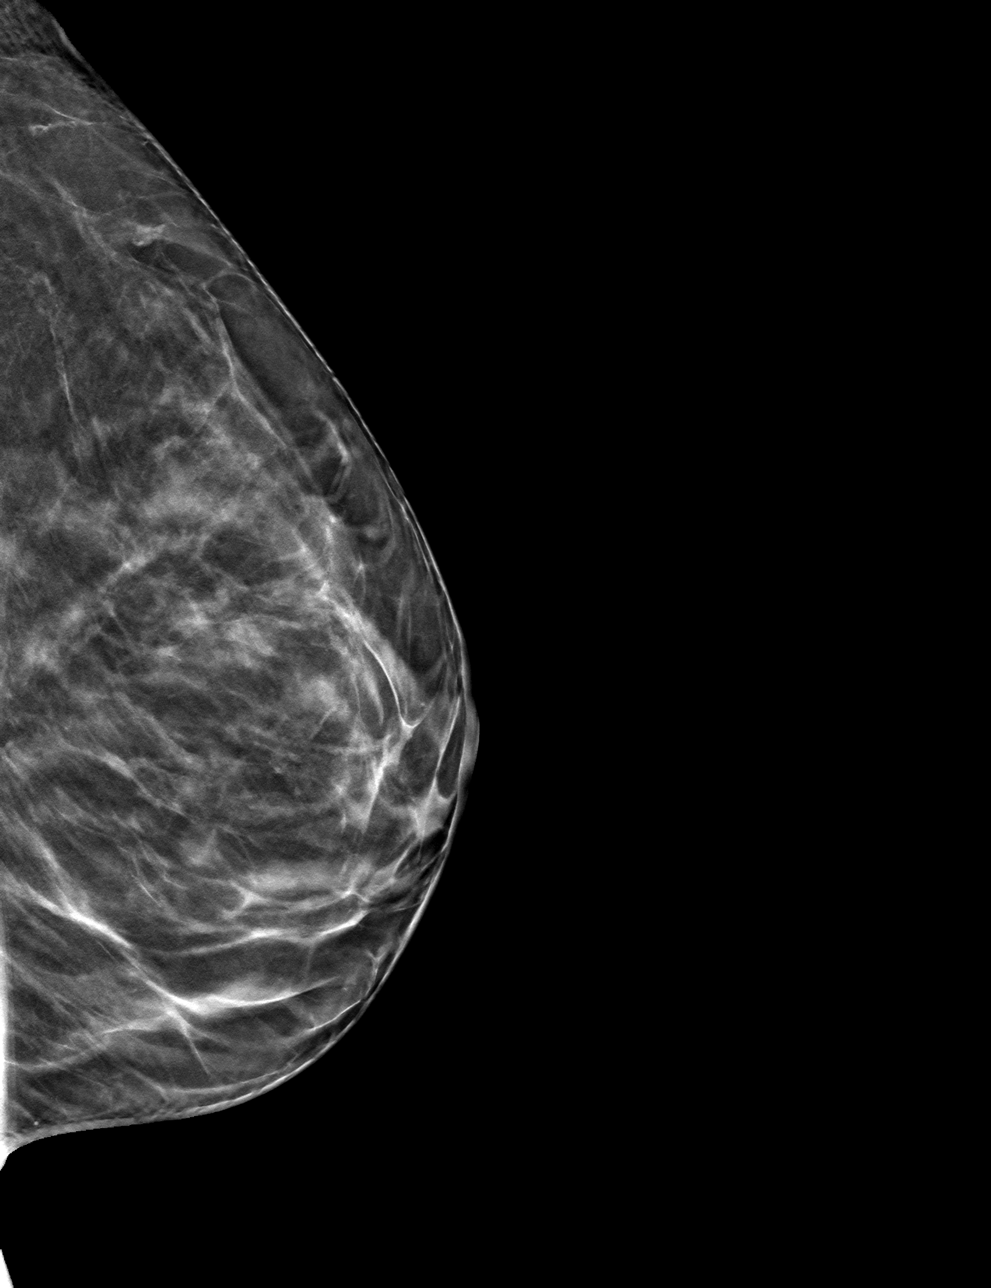

[L MLO tomo · tomo slice 29/56.0]
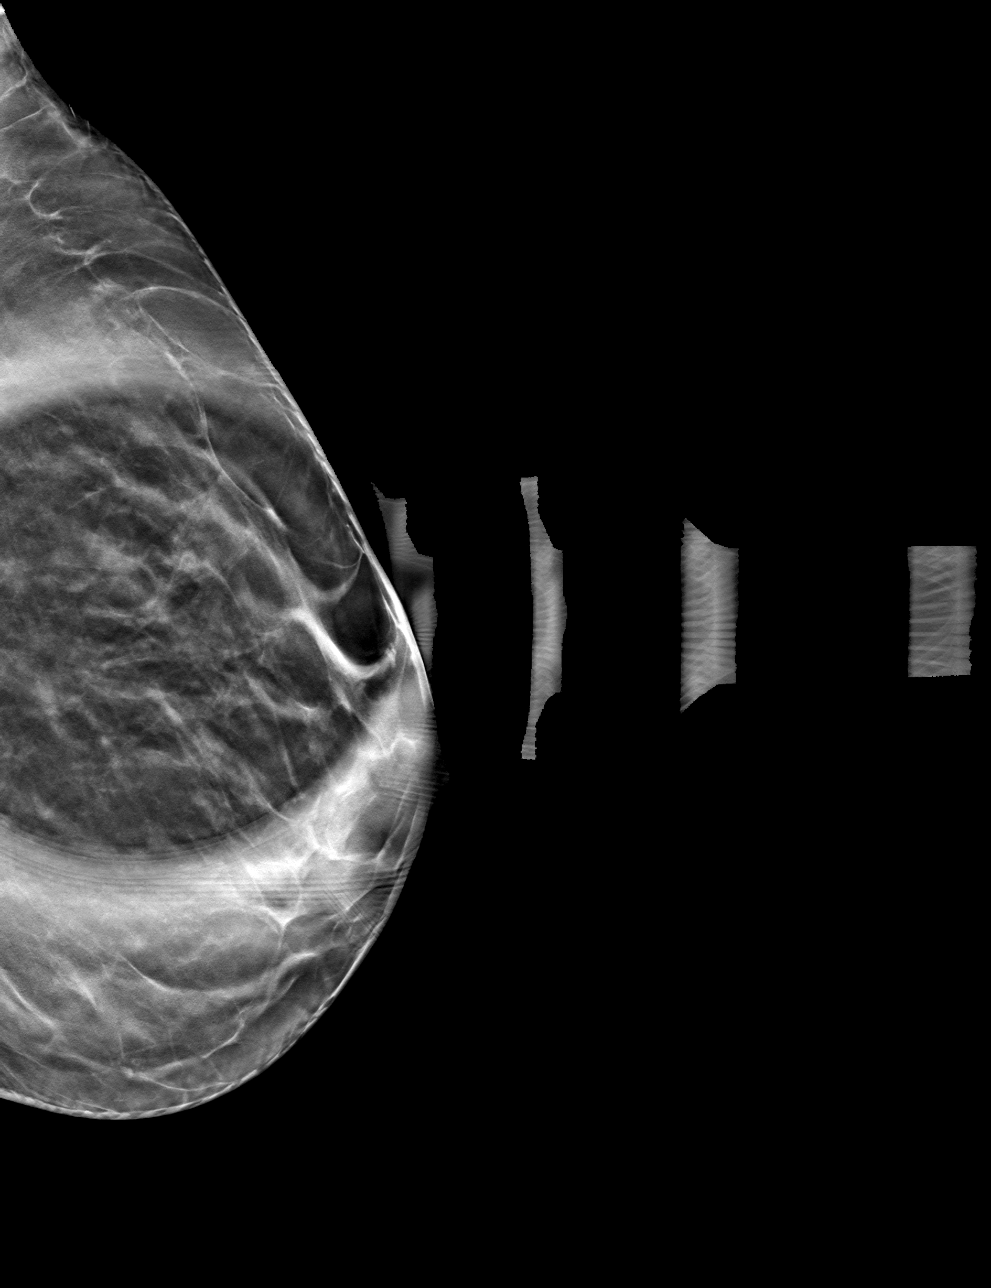

[4 of 12 positions shown; findings below may reference images not displayed]

ACR Breast Density Category b: There are scattered areas of
fibroglandular density.
FINDINGS: On today's additional diagnostic views, including spot compression
with 3D tomosynthesis, a partially obscured low-density mass is
confirmed within the-slightly outer LEFT breast, corresponding to
patient's previously demonstrated benign cyst. On today's additional
diagnostic views, there is no new asymmetry seen within the LEFT
breast indicating superimposition of normal fibroglandular tissues
and patient's known LEFT breast cyst

Targeted ultrasound is performed, again showing the benign cyst in
the LEFT breast at the 4 o'clock axis, measuring 6 mm, corresponding
to the mammographic finding. No new solid or cystic mass is seen by
ultrasound.
IMPRESSION: No evidence of malignancy. Benign cyst in the LEFT breast at the 4
o'clock axis, measuring 6 mm.

Patient may return to routine annual bilateral screening mammogram
schedule.

RECOMMENDATION:
Screening mammogram in one year.(Code:[id number])

I have discussed the findings and recommendations with the patient.
If applicable, a reminder letter will be sent to the patient
regarding the next appointment.

BI-RADS CATEGORY  2: Benign.

## 2023-08-26 DIAGNOSIS — D2262 Melanocytic nevi of left upper limb, including shoulder: Secondary | ICD-10-CM | POA: Diagnosis not present

## 2023-08-26 DIAGNOSIS — L821 Other seborrheic keratosis: Secondary | ICD-10-CM | POA: Diagnosis not present

## 2023-08-26 DIAGNOSIS — L814 Other melanin hyperpigmentation: Secondary | ICD-10-CM | POA: Diagnosis not present

## 2023-08-26 DIAGNOSIS — D2272 Melanocytic nevi of left lower limb, including hip: Secondary | ICD-10-CM | POA: Diagnosis not present

## 2023-08-26 DIAGNOSIS — D485 Neoplasm of uncertain behavior of skin: Secondary | ICD-10-CM | POA: Diagnosis not present

## 2023-08-26 DIAGNOSIS — D225 Melanocytic nevi of trunk: Secondary | ICD-10-CM | POA: Diagnosis not present

## 2023-08-26 DIAGNOSIS — L578 Other skin changes due to chronic exposure to nonionizing radiation: Secondary | ICD-10-CM | POA: Diagnosis not present

## 2023-09-07 DIAGNOSIS — M25372 Other instability, left ankle: Secondary | ICD-10-CM | POA: Diagnosis not present

## 2023-09-07 DIAGNOSIS — M25672 Stiffness of left ankle, not elsewhere classified: Secondary | ICD-10-CM | POA: Diagnosis not present

## 2023-09-07 DIAGNOSIS — M25572 Pain in left ankle and joints of left foot: Secondary | ICD-10-CM | POA: Diagnosis not present

## 2023-09-10 DIAGNOSIS — Z13820 Encounter for screening for osteoporosis: Secondary | ICD-10-CM | POA: Diagnosis not present

## 2023-09-17 DIAGNOSIS — M25572 Pain in left ankle and joints of left foot: Secondary | ICD-10-CM | POA: Diagnosis not present

## 2023-09-17 DIAGNOSIS — M25372 Other instability, left ankle: Secondary | ICD-10-CM | POA: Diagnosis not present

## 2023-09-17 DIAGNOSIS — M25672 Stiffness of left ankle, not elsewhere classified: Secondary | ICD-10-CM | POA: Diagnosis not present

## 2023-12-09 DIAGNOSIS — F419 Anxiety disorder, unspecified: Secondary | ICD-10-CM | POA: Diagnosis not present

## 2023-12-09 DIAGNOSIS — R198 Other specified symptoms and signs involving the digestive system and abdomen: Secondary | ICD-10-CM | POA: Diagnosis not present

## 2023-12-09 DIAGNOSIS — I10 Essential (primary) hypertension: Secondary | ICD-10-CM | POA: Diagnosis not present

## 2024-03-16 NOTE — Progress Notes (Signed)
 Emily Valdez                                          MRN: [id number]   03/16/2024   The [facility] Quality Team Specialist reviewed this patient medical record for the purposes of chart review for care gap closure. The following were reviewed: chart review for care gap closure-controlling blood pressure.    [facility] Quality Team

## 2024-05-31 DIAGNOSIS — M542 Cervicalgia: Secondary | ICD-10-CM | POA: Diagnosis not present
